# Patient Record
Sex: Female | Born: 1995 | Race: White | Hispanic: No | Marital: Single | Smoking: Never smoker
Health system: Southern US, Community
[De-identification: ages and names within clinical notes are randomized; demographics above are authoritative.]

---
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
## 2014-03-11 ENCOUNTER — Ambulatory Visit: Payer: Self-pay | Admitting: Family Medicine

## 2015-06-10 DIAGNOSIS — M79671 Pain in right foot: Secondary | ICD-10-CM | POA: Diagnosis not present

## 2015-06-13 ENCOUNTER — Other Ambulatory Visit: Payer: Self-pay | Admitting: Family Medicine

## 2015-06-13 DIAGNOSIS — M722 Plantar fascial fibromatosis: Secondary | ICD-10-CM

## 2015-06-17 ENCOUNTER — Ambulatory Visit
Admission: RE | Admit: 2015-06-17 | Discharge: 2015-06-17 | Disposition: A | Discharge status: Home / Self Care | Payer: 59 | Source: Ambulatory Visit | Attending: Family Medicine | Admitting: Family Medicine

## 2015-06-17 DIAGNOSIS — M722 Plantar fascial fibromatosis: Secondary | ICD-10-CM

## 2015-06-17 DIAGNOSIS — M25571 Pain in right ankle and joints of right foot: Secondary | ICD-10-CM | POA: Diagnosis present

## 2015-09-22 ENCOUNTER — Encounter: Payer: Self-pay | Admitting: Family Medicine

## 2015-09-22 ENCOUNTER — Ambulatory Visit (INDEPENDENT_AMBULATORY_CARE_PROVIDER_SITE_OTHER): Payer: 59 | Admitting: Family Medicine

## 2015-09-22 VITALS — BP 125/71 | HR 66 | Temp 98.2°F | Resp 14

## 2015-09-22 DIAGNOSIS — M722 Plantar fascial fibromatosis: Secondary | ICD-10-CM

## 2015-09-22 NOTE — Progress Notes (Signed)
Patient ID: Mary Yu, female   DOB: 1995/09/13, 20 y.o.   MRN: [id number]  Patient presents today with symptoms of chronic right plantar fasciitis. Patient has had symptoms for several months. She has tried conservative treatment including night splints without significant relief. She states that she would like to try a cortisone shot in the area to see if she can get some relief. Patient has had imaging done of the foot in the past.  ROS: Negative except mentioned above.  Vitals as per Epic GENERAL: NAD RESP: CTA B CARD: RRR MSK: R Foot- no obvious deformity, mild to moderate tenderness noted at the insertion of the plantar fascia along the medial aspect of the calcaneus, full range of motion, NV intact NEURO: CN II-XII grossly intact   A/P: R Foot Plantar Fascitis- risk/benefits discussed, patient gave verbal consent for the procedure, sterile technique was used, Betadine and alcohol was used to clean the site, ethyl chloride was used as a local anesthetic, lidocaine 1% without epi and 40 mg of Kenalog was injected at maximal area of tenderness with a 20-gauge needle, patient tolerated procedure well, dressing placed over site, post injection instructions given, she is to do no significant physical activity today, ice the area once or twice today. She states that she does not have any activity with Lacrosse for a few days. She'll progress her activity with her trainer. Seek medical attention if any problems.

## 2016-03-13 ENCOUNTER — Encounter: Payer: Self-pay | Admitting: Family Medicine

## 2016-03-13 ENCOUNTER — Ambulatory Visit (INDEPENDENT_AMBULATORY_CARE_PROVIDER_SITE_OTHER): Payer: Commercial Managed Care - HMO | Admitting: Family Medicine

## 2016-03-13 DIAGNOSIS — M722 Plantar fascial fibromatosis: Secondary | ICD-10-CM

## 2016-03-13 MED ORDER — MELOXICAM 15 MG PO TABS: 15.0000 mg | ORAL_TABLET | Freq: Every day | ORAL | 0 refills | Status: AC

## 2016-03-13 NOTE — Progress Notes (Signed)
Patient presents today with history of bilateral plantar fasciitis. Patient did have a steroid injection for her plantar fasciitis in the right foot last spring. She states that the injection did help some but did not completely take away her pain. She has tried night splints, heel cups, orthotics. Patient states that over the summer she did not have a significant amount of pain. She denies ever seeing any bruising of the area. She does have pain with taking her first step in the morning out of bed. She has seen PT in the past as well. She does admit that the night splint that she has is falling apart and asks for a new one.  ROS: Negative except mentioned above.  Vitals as per Epic.  GENERAL: NAD RESP: CTA B CARD: RRR MSK: Feet- no swelling or ecchymosis, mild to moderate tenderness at the insertion of the plantar fascia on the calcaneus, full range of motion, NV intact NEURO: CN II-XII grossly intact   A/P: Bilateral plantar fasciitis - will discuss with trainer to order new night splints, will try oral Mobicox for 2 weeks when necessary, would like patient to see PT again as well, if heel cups helping continue wearing these. Patient is a [employer] and is fine with continuing current conservative plan for now but may want to consider doing a steroid injection in the future. Will discuss plan with trainer and patient will follow up with me if needed.

## 2016-03-29 ENCOUNTER — Other Ambulatory Visit: Payer: Self-pay | Admitting: Family Medicine

## 2016-03-29 DIAGNOSIS — M79671 Pain in right foot: Secondary | ICD-10-CM

## 2016-04-09 ENCOUNTER — Ambulatory Visit: Payer: Commercial Managed Care - HMO

## 2016-04-12 ENCOUNTER — Ambulatory Visit
Admission: RE | Admit: 2016-04-12 | Discharge: 2016-04-12 | Disposition: A | Discharge status: Home / Self Care | Payer: Commercial Managed Care - HMO | Source: Ambulatory Visit | Attending: Family Medicine | Admitting: Family Medicine

## 2016-04-12 DIAGNOSIS — M79671 Pain in right foot: Secondary | ICD-10-CM

## 2016-04-12 DIAGNOSIS — X58XXXA Exposure to other specified factors, initial encounter: Secondary | ICD-10-CM | POA: Insufficient documentation

## 2016-04-12 DIAGNOSIS — S96811A Strain of other specified muscles and tendons at ankle and foot level, right foot, initial encounter: Secondary | ICD-10-CM | POA: Insufficient documentation

## 2016-04-17 ENCOUNTER — Ambulatory Visit (INDEPENDENT_AMBULATORY_CARE_PROVIDER_SITE_OTHER): Payer: Commercial Managed Care - HMO | Admitting: Family Medicine

## 2016-04-17 DIAGNOSIS — M722 Plantar fascial fibromatosis: Secondary | ICD-10-CM

## 2016-04-17 NOTE — Progress Notes (Signed)
Patient presents today for follow-up regarding chronic plantar fasciitis. Patient states that her right plantar fascia pain is worse than her left. At times with more activity she does feel more discomfort. She does wear her night splints and does wear heel cups in certain shoes. She has continued to do rehabilitation with her trainer. She is had one steroid injection in the past which did not provide significant relief of her pain. She is here to discuss possibility of PRP injection. The procedure was discussed with the patient and patient would like to meet with Dr. Ruthe MannanBoggess to discuss further. Patient understands that there will be some [facility] We'll try to make this appointment for the patient after she returns back from fall break.

## 2016-04-20 ENCOUNTER — Ambulatory Visit: Payer: Commercial Managed Care - HMO | Admitting: Family Medicine

## 2016-08-27 ENCOUNTER — Encounter: Payer: Self-pay | Admitting: Family Medicine

## 2016-08-27 ENCOUNTER — Ambulatory Visit (INDEPENDENT_AMBULATORY_CARE_PROVIDER_SITE_OTHER): Payer: Commercial Managed Care - HMO | Admitting: Family Medicine

## 2016-08-27 VITALS — BP 121/57 | HR 78 | Temp 97.6°F | Resp 14

## 2016-08-27 DIAGNOSIS — S060X0A Concussion without loss of consciousness, initial encounter: Secondary | ICD-10-CM

## 2016-08-27 NOTE — Progress Notes (Signed)
Patient presents today for concussive symptoms. Patient states that her head hit the ground during a lacrosse game a few days ago. She became emotional after the incident which led to the diagnosis of concussion. Had symptoms of headache and fogginess a little later. Symptoms have improved since the incident. Has some left upper trap tenderness remaining. Was able to go to class without problems. No headache now. Admits to one previous concussion last year. No residual symptoms from that. Has not been taking any medications for her current symptoms.   ROS: Negative except mentioned above. Vitals as per Epic.  GENERAL: NAD RESP: CTA B CARD: RRR MSK: no midline tenderness of spine or occiput, mild left upper trap tenderness, FROM, -Spurlings  NEURO: CN II-XII grossly intact, -nystagmus, -Rombergs  A/P: Concussion - follow protocol, will take Impact when fully asymptomatic, active rehab with training staff, seek medical attention if symptoms persist or worsen as discussed.

## 2016-10-16 ENCOUNTER — Encounter: Payer: Self-pay | Admitting: Family Medicine

## 2016-10-16 ENCOUNTER — Ambulatory Visit (INDEPENDENT_AMBULATORY_CARE_PROVIDER_SITE_OTHER): Payer: Commercial Managed Care - HMO | Admitting: Family Medicine

## 2016-10-16 DIAGNOSIS — M79671 Pain in right foot: Secondary | ICD-10-CM

## 2016-10-16 DIAGNOSIS — M79672 Pain in left foot: Secondary | ICD-10-CM

## 2016-10-16 MED ORDER — DICLOFENAC SODIUM 75 MG PO TBEC: 75.0000 mg | DELAYED_RELEASE_TABLET | Freq: Two times a day (BID) | ORAL | 0 refills | Status: AC

## 2016-10-16 NOTE — Progress Notes (Signed)
Patient presents today for bilateral heel pain. Patient states that recently she is started to experience the bilateral heel pain again. Patient has had steroid injections and has also had TENX procedure. Most of her pain is in the mornings and also after practice. She states that she does not feel pain during practice. She does have the heels taped by her trainer. She denies any changes in her shoewear. She does wear heel cups. She does not wear her night splints anymore. She is not taking any anti-inflammatory medication. 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 Patient does feel that when she stops playing lacrosse she will no longer have the heel pain. She does not have any heel pain at rest or at night.  ROS: Negative except mentioned above. Vitals as per Epic. GENERAL: NAD MSK: No significant gross abnormality of the feet, mild tenderness noted at the insertion of the plantar fascia on the calcaneus, there is some pain on the medial aspect as well of the calcaneus, no arch tenderness, full range of motion, negative hop test, NV intact NEURO: CN II-XII grossly intact   A/P: Bilateral heel pain - discussed with patient that I would recommend that she go back to wearing her night splints, will try twice daily Voltaren prn, continue using ice, look at shoewear with trainer and modify if needed, modify activity with practices/games, follow-up if symptoms persist or worsen as discussed.

## 2017-05-09 IMAGING — MR MR FOOT*R* W/O CM
6 series · 36 of 40 positions shown · non-contrast
Comparison: MRI right ankle 06/17/2015.

CLINICAL DATA: Right heel pain for 1 year. History of plantar
fasciitis.

EXAM:
MRI OF THE RIGHT ANKLE WITHOUT CONTRAST
TECHNIQUE: Multiplanar, multisequence MR imaging of the ankle was performed. No
intravenous contrast was administered.

[Series 3: PD fat-sat · axial · 3.0mm · 0.47mm/px · z∈[-95,+17]mm · 8 of 32 slices shown]
[im 1/32]
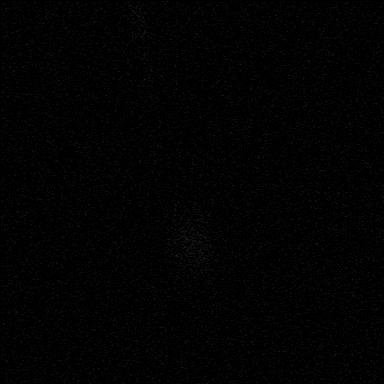
[im 5/32]
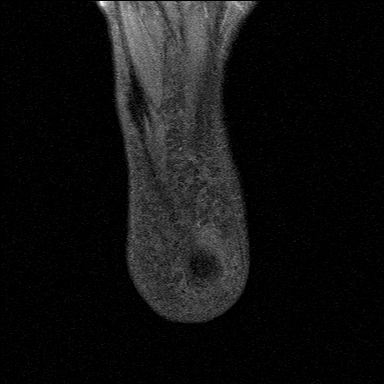
[im 9/32]
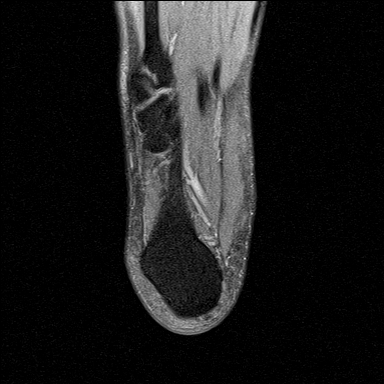
[im 14/32]
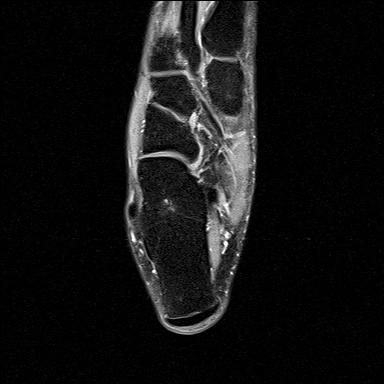
[im 18/32]
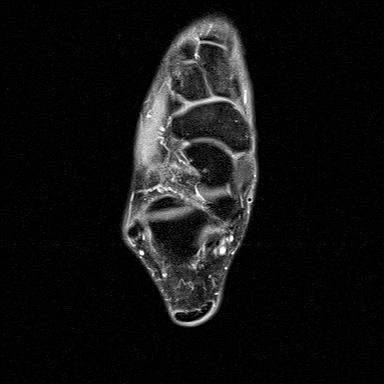
[im 23/32]
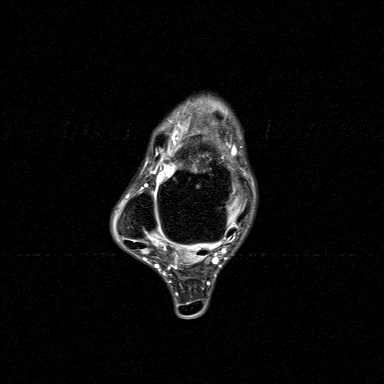
[im 27/32]
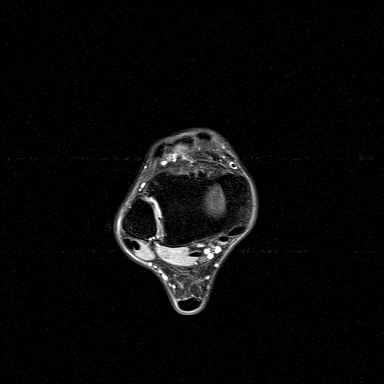
[im 32/32]
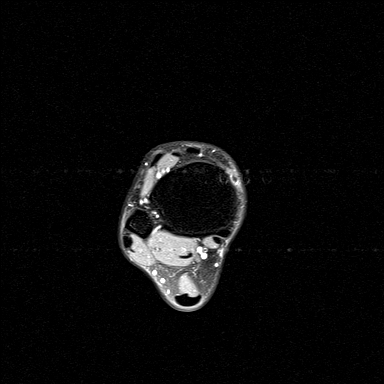

[Series 4: T2 fat-sat · axial · 3.0mm · 0.47mm/px · z∈[-95,+17]mm · 7 of 32 slices shown (1 of 3)]
[im 1/32]
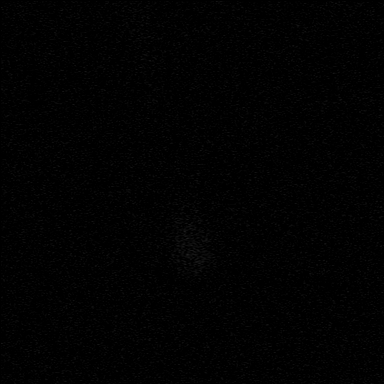
[im 6/32]
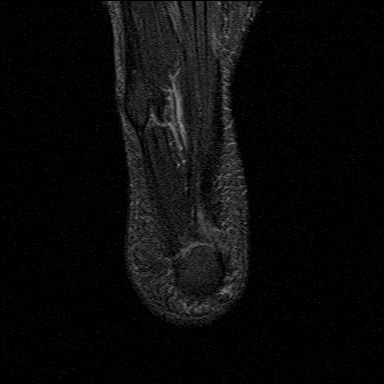
[im 11/32]
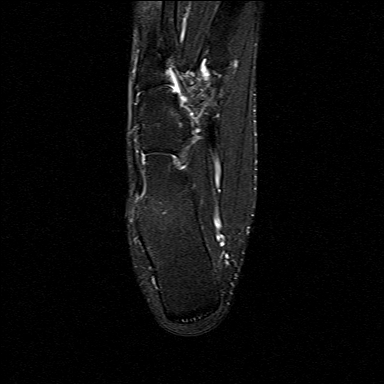
[im 16/32]
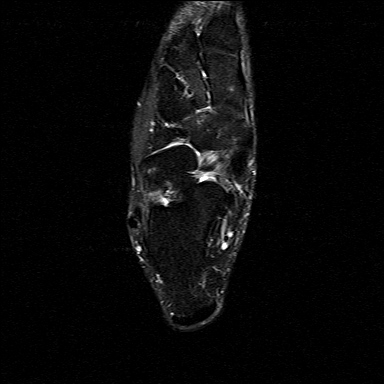
[im 21/32]
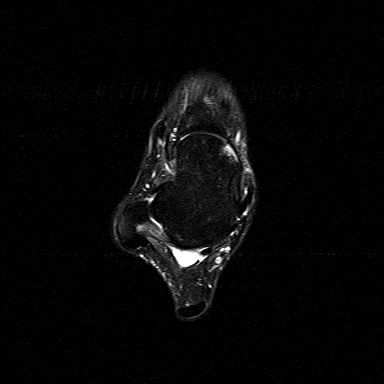
[im 26/32]
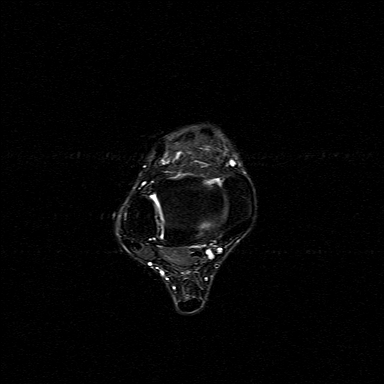
[im 32/32]
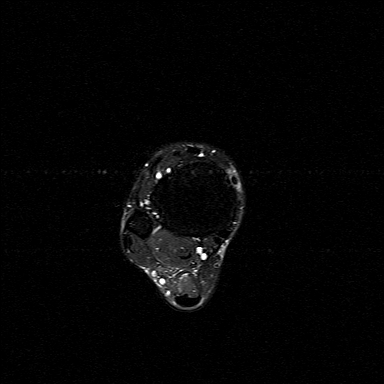

[Series 5: T2 fat-sat · coronal · 3.0mm · 0.47mm/px · 8 of 49 slices shown (2 of 3)]
[im 1/49]
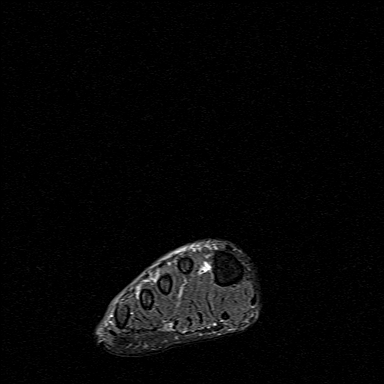
[im 6/49]
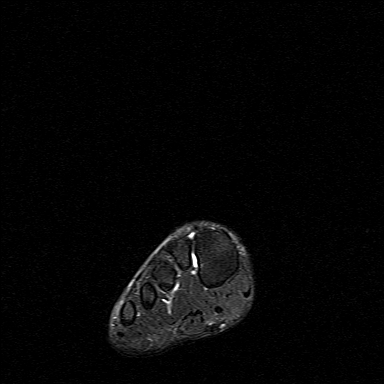
[im 17/49]
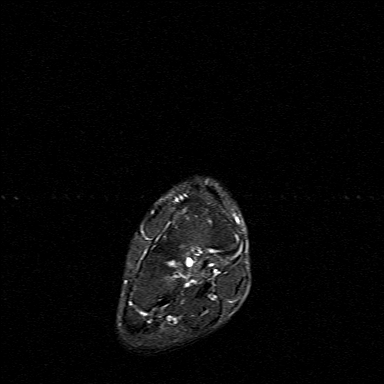
[im 22/49]
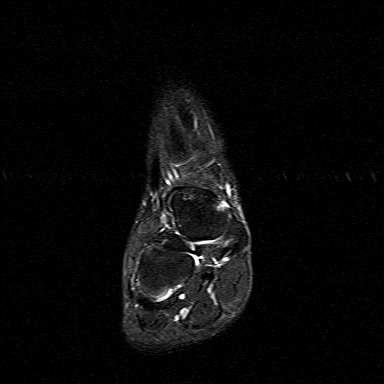
[im 27/49]
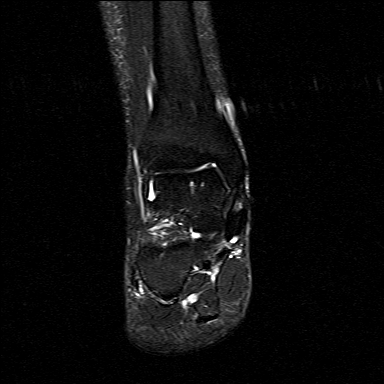
[im 33/49]
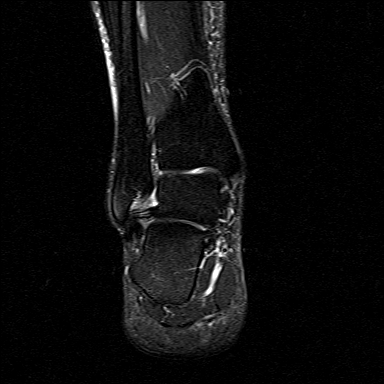
[im 43/49]
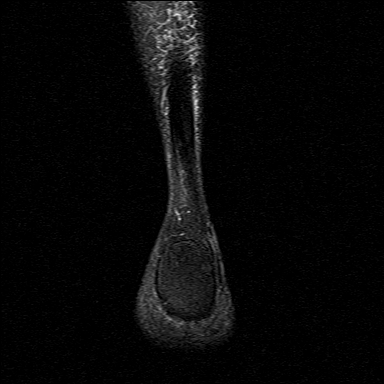
[im 49/49]
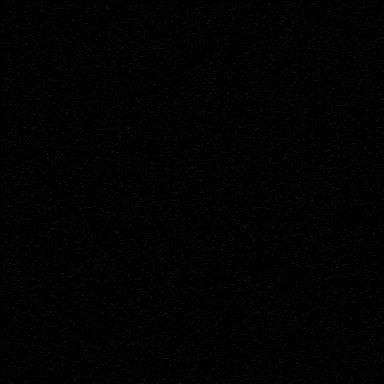

[Series 6: T2 fat-sat · sagittal · 3.0mm · 0.70mm/px · 5 of 25 slices shown (3 of 3)]
[im 1/25]
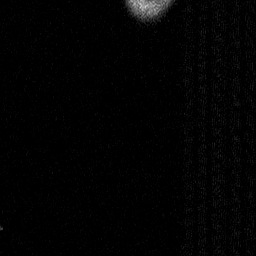
[im 7/25]
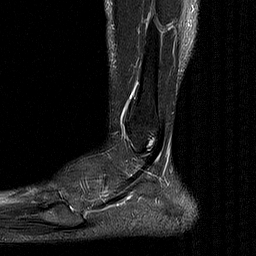
[im 13/25]
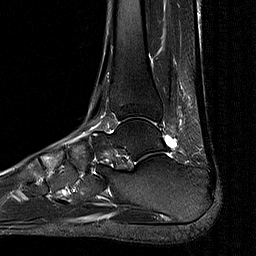
[im 19/25]
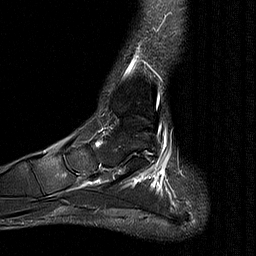
[im 25/25]
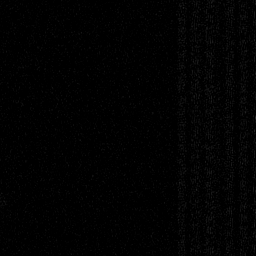

[Series 7: T1 · sagittal · 3.0mm · 0.56mm/px · 5 of 25 slices shown]
[im 1/25]
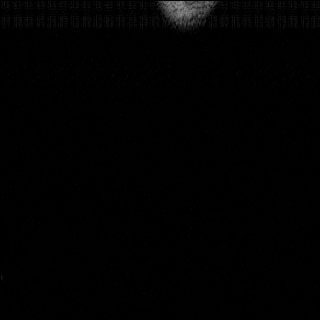
[im 7/25]
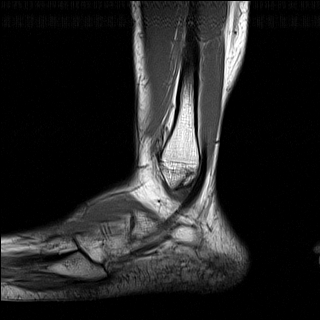
[im 13/25]
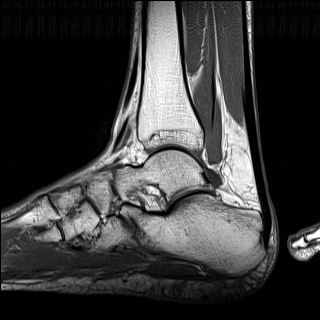
[im 19/25]
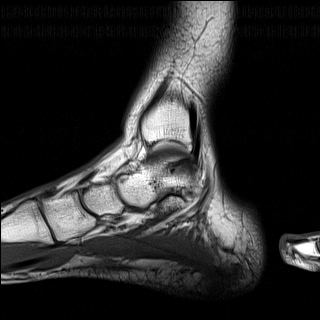
[im 25/25]
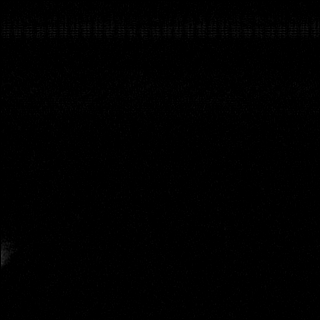

[Series 8: STIR · sagittal · 3.0mm · 0.35mm/px · 3 of 25 slices shown]
[im 1/25]
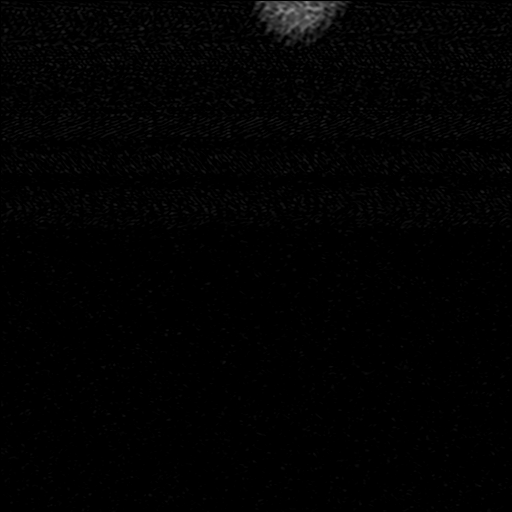
[im 7/25]
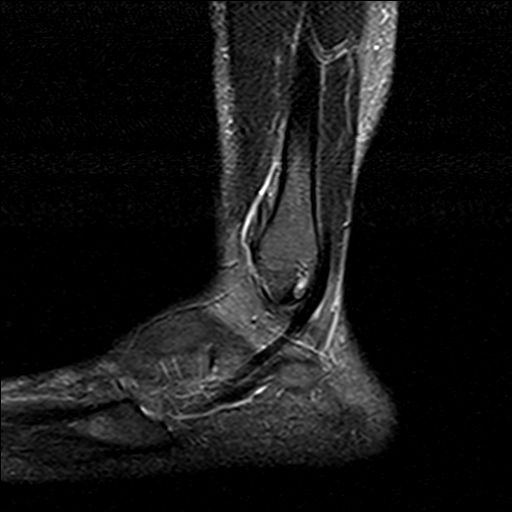
[im 13/25]
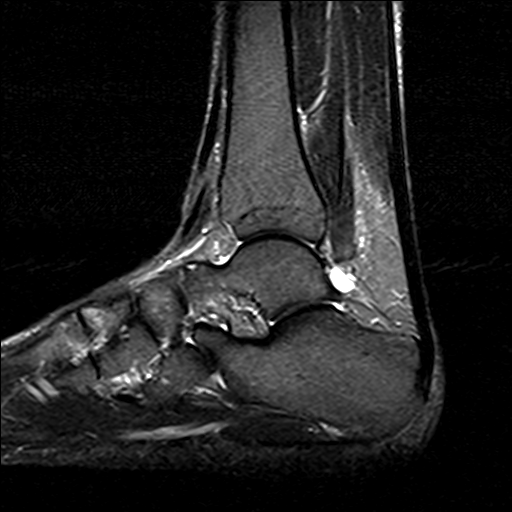

[36 of 40 positions shown; findings below may reference images not displayed]

FINDINGS: TENDONS

Peroneal: Intact.

Posteromedial: Intact.

Anterior: Intact.

Achilles: Intact.

Plantar Fascia: The medial cord of plantar fascia is torn from the
calcaneus, new since the prior examination. There is mild edema
within the proximal plantar fascia at the site of the tear but no
other soft tissue is present and there is no marrow edema. The
lateral cord is unremarkable.

LIGAMENTS

Lateral: Intact.

Medial: Intact.

CARTILAGE

Ankle Joint: Unremarkable.

Subtalar Joints/Sinus Tarsi: Unremarkable.

Bones: New mild edema is seen and the middle cuneiform.

Other: None.
IMPRESSION: Rupture of the medial cord of the plantar fascia is new since the
prior MRI but appears subacute to remote.

New marrow edema in the middle cuneiform at the tarsometatarsal
joints most consistent with stress change.
